# Patient Record
Sex: Female | Born: 1980 | Race: Black or African American | Hispanic: No | Marital: Single | Smoking: Never smoker
Health system: Southern US, Community
[De-identification: ages and names within clinical notes are randomized; demographics above are authoritative.]

## PROBLEM LIST (undated history)

## (undated) DIAGNOSIS — E079 Disorder of thyroid, unspecified: Secondary | ICD-10-CM

---
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
## 2002-12-03 ENCOUNTER — Emergency Department (HOSPITAL_COMMUNITY): Admission: EM | Admit: 2002-12-03 | Discharge: 2002-12-03 | Payer: Self-pay | Admitting: *Deleted

## 2003-04-28 ENCOUNTER — Emergency Department (HOSPITAL_COMMUNITY): Admission: EM | Admit: 2003-04-28 | Discharge: 2003-04-29 | Payer: Self-pay | Admitting: Emergency Medicine

## 2016-01-16 ENCOUNTER — Encounter (HOSPITAL_BASED_OUTPATIENT_CLINIC_OR_DEPARTMENT_OTHER): Payer: Self-pay | Admitting: *Deleted

## 2016-01-16 ENCOUNTER — Emergency Department (HOSPITAL_BASED_OUTPATIENT_CLINIC_OR_DEPARTMENT_OTHER)
Admission: EM | Admit: 2016-01-16 | Discharge: 2016-01-16 | Disposition: A | Discharge status: Home / Self Care | Payer: Self-pay | Attending: Emergency Medicine | Admitting: Emergency Medicine

## 2016-01-16 DIAGNOSIS — Z79899 Other long term (current) drug therapy: Secondary | ICD-10-CM | POA: Insufficient documentation

## 2016-01-16 DIAGNOSIS — Z3202 Encounter for pregnancy test, result negative: Secondary | ICD-10-CM | POA: Insufficient documentation

## 2016-01-16 DIAGNOSIS — N760 Acute vaginitis: Secondary | ICD-10-CM | POA: Insufficient documentation

## 2016-01-16 DIAGNOSIS — N390 Urinary tract infection, site not specified: Secondary | ICD-10-CM | POA: Insufficient documentation

## 2016-01-16 DIAGNOSIS — B9689 Other specified bacterial agents as the cause of diseases classified elsewhere: Secondary | ICD-10-CM

## 2016-01-16 DIAGNOSIS — E079 Disorder of thyroid, unspecified: Secondary | ICD-10-CM | POA: Insufficient documentation

## 2016-01-16 HISTORY — DX: Disorder of thyroid, unspecified: E07.9

## 2016-01-16 LAB — URINE MICROSCOPIC-ADD ON

## 2016-01-16 LAB — URINALYSIS, ROUTINE W REFLEX MICROSCOPIC
Bilirubin Urine: NEGATIVE
Glucose, UA: NEGATIVE mg/dL
KETONES UR: NEGATIVE mg/dL
NITRITE: NEGATIVE
PH: 5.5 (ref 5.0–8.0)
PROTEIN: NEGATIVE mg/dL
Specific Gravity, Urine: 1.037 — ABNORMAL HIGH (ref 1.005–1.030)

## 2016-01-16 LAB — CBC
HCT: 37 % (ref 36.0–46.0)
Hemoglobin: 12.2 g/dL (ref 12.0–15.0)
MCH: 28.4 pg (ref 26.0–34.0)
MCHC: 33 g/dL (ref 30.0–36.0)
MCV: 86.2 fL (ref 78.0–100.0)
Platelets: 339 10*3/uL (ref 150–400)
RBC: 4.29 MIL/uL (ref 3.87–5.11)
RDW: 13.5 % (ref 11.5–15.5)
WBC: 6.1 10*3/uL (ref 4.0–10.5)

## 2016-01-16 LAB — WET PREP, GENITAL
TRICH WET PREP: NONE SEEN
Yeast Wet Prep HPF POC: NONE SEEN

## 2016-01-16 LAB — COMPREHENSIVE METABOLIC PANEL
ALT: 16 U/L (ref 14–54)
ANION GAP: 5 (ref 5–15)
AST: 24 U/L (ref 15–41)
Albumin: 4 g/dL (ref 3.5–5.0)
Alkaline Phosphatase: 62 U/L (ref 38–126)
BUN: 14 mg/dL (ref 6–20)
CHLORIDE: 106 mmol/L (ref 101–111)
CO2: 27 mmol/L (ref 22–32)
CREATININE: 0.88 mg/dL (ref 0.44–1.00)
Calcium: 8.9 mg/dL (ref 8.9–10.3)
Glucose, Bld: 76 mg/dL (ref 65–99)
POTASSIUM: 3.5 mmol/L (ref 3.5–5.1)
Sodium: 138 mmol/L (ref 135–145)
Total Bilirubin: 0.4 mg/dL (ref 0.3–1.2)
Total Protein: 7.2 g/dL (ref 6.5–8.1)

## 2016-01-16 LAB — LIPASE, BLOOD: LIPASE: 22 U/L (ref 11–51)

## 2016-01-16 LAB — PREGNANCY, URINE: Preg Test, Ur: NEGATIVE

## 2016-01-16 MED ORDER — METRONIDAZOLE 500 MG PO TABS: 500.0000 mg | ORAL_TABLET | Freq: Two times a day (BID) | ORAL | Status: DC

## 2016-01-16 MED ORDER — CEPHALEXIN 500 MG PO CAPS: 500.0000 mg | ORAL_CAPSULE | Freq: Two times a day (BID) | ORAL | Status: DC

## 2016-01-16 MED ORDER — SODIUM CHLORIDE 0.9 % IV BOLUS (SEPSIS)
1000.0000 mL | Freq: Once | INTRAVENOUS | Status: AC
  Administered 2016-01-16: 1000 mL via INTRAVENOUS

## 2016-01-16 MED ORDER — MORPHINE SULFATE (PF) 4 MG/ML IV SOLN
4.0000 mg | Freq: Once | INTRAVENOUS | Status: AC
  Administered 2016-01-16: 4 mg via INTRAVENOUS
  Filled 2016-01-16: qty 1

## 2016-01-16 NOTE — Discharge Instructions (Signed)
Please read and follow all provided instructions.  Your diagnoses today include:  1. BV (bacterial vaginosis)   2. UTI (lower urinary tract infection)    Tests performed today include:  Urine test - suggests that you have an infection in your bladder  Vital signs. See below for your results today.   Medications prescribed:   Take medication as prescribed   Home care instructions:  Follow any educational materials contained in this packet.  Follow-up instructions: Please follow-up with your primary care provider in week if symptoms are not resolved for further evaluation of your symptoms.  Return instructions:   Please return to the Emergency Department if you experience worsening symptoms.   Return with fever, worsening pain, persistent vomiting, worsening pain in your back.   Please return if you have any other emergent concerns.  Additional Information:  Your vital signs today were: BP 123/68 mmHg   Pulse 62   Temp(Src) 99 F (37.2 C) (Oral)   Resp 20   Ht 5\' 2"  (1.575 m)   Wt 92.987 kg   BMI 37.49 kg/m2   SpO2 100% If your blood pressure (BP) was elevated above 135/85 this visit, please have this repeated by your doctor within one month. --------------

## 2016-01-16 NOTE — ED Notes (Signed)
Patient c/o intermittent abd pain and nausea over the past two weeks, also c/o vaginal discharge

## 2016-01-16 NOTE — ED Provider Notes (Signed)
CSN: [id number]     Arrival date & time 01/16/16  1217 History   First MD Initiated Contact with Patient 01/16/16 1234     Chief Complaint  Patient presents with  . Abdominal Pain   (Consider location/radiation/quality/duration/timing/severity/associated sxs/prior Treatment) HPI 35 y.o. female presents to the Emergency Department today complaining of generalized abdominal pain x 1-2 weeks. Noted on RLQ, Suprapubic and LLQ. Some pain noted RUQ. Notes pain is mostly RLQ and 8/10. Sharp. Intermittent. Notes N/V/D. No CP/SOB. Noted vaginal discharge as well. No Bleeding. No Dysuria. Has not tried any OTC remedies. LMP beginning of February. No OCP. No fevers. No other symptoms noted.    Past Medical History  Diagnosis Date  . Thyroid disease    Past Surgical History  Procedure Laterality Date  . Back surgery  1992    lumbar   No family history on file. Social History  Substance Use Topics  . Smoking status: Never Smoker   . Smokeless tobacco: None  . Alcohol Use: No   OB History    No data available     Review of Systems ROS reviewed and all are negative for acute change except as noted in the HPI.  Allergies  Review of patient's allergies indicates no known allergies.  Home Medications   Prior to Admission medications   Medication Sig Start Date End Date Taking? Authorizing Provider  levothyroxine (SYNTHROID, LEVOTHROID) 88 MCG tablet Take 88 mcg by mouth daily before breakfast.   Yes Historical Provider, MD   BP 123/68 mmHg  Pulse 62  Temp(Src) 99 F (37.2 C) (Oral)  Resp 20  Ht  (1.575 m)  Wt 92.987 kg  BMI 37.49 kg/m2  SpO2 100%   Physical Exam  Constitutional: She is oriented to person, place, and time. She appears well-developed and well-nourished.  HENT:  Head: Normocephalic and atraumatic.  Eyes: EOM are normal. Pupils are equal, round, and reactive to light.  Neck: Normal range of motion.  Cardiovascular: Normal rate, regular rhythm and normal  heart sounds.   No murmur heard. Pulmonary/Chest: Effort normal and breath sounds normal. No respiratory distress. She has no wheezes. She has no rales. She exhibits no tenderness.  Abdominal: Soft. Normal appearance and bowel sounds are normal. There is tenderness in the right upper quadrant, right lower quadrant, suprapubic area and left lower quadrant. There is no rigidity, no rebound, no guarding, no CVA tenderness, no tenderness at McBurney's point and negative Murphy's sign.  Musculoskeletal: Normal range of motion.  Neurological: She is alert and oriented to person, place, and time.  Skin: Skin is warm and dry.  Psychiatric: She has a normal mood and affect. Her behavior is normal. Thought content normal.  Nursing note and vitals reviewed.  Exam performed by Eston Esters,  exam chaperoned Date: 01/16/2016 Pelvic exam: normal external genitalia without evidence of trauma. VULVA: normal appearing vulva with no masses, tenderness or lesion. VAGINA: normal appearing vagina with normal color and discharge, no lesions. CERVIX: normal appearing cervix without lesions, cervical motion tenderness absent, cervical os closed with out purulent discharge; vaginal discharge - clear and thin, Wet prep and DNA probe for chlamydia and GC obtained.   ADNEXA: normal adnexa in size, nontender and no masses UTERUS: uterus is normal size, shape, consistency and nontender.    ED Course  Procedures (including critical care time) Labs Review Labs Reviewed  WET PREP, GENITAL - Abnormal; Notable for the following:    Clue Cells Wet Prep HPF POC  PRESENT (*)    WBC, Wet Prep HPF POC MODERATE (*)    All other components within normal limits  URINALYSIS, ROUTINE W REFLEX MICROSCOPIC (NOT AT [facility]) - Abnormal; Notable for the following:    APPearance CLOUDY (*)    Specific Gravity, Urine 1.037 (*)    Hgb urine dipstick SMALL (*)    Leukocytes, UA MODERATE (*)    All other components within normal limits   URINE MICROSCOPIC-ADD ON - Abnormal; Notable for the following:    Squamous Epithelial / LPF 0-5 (*)    Bacteria, UA MANY (*)    Crystals CA OXALATE CRYSTALS (*)    All other components within normal limits  PREGNANCY, URINE  CBC  COMPREHENSIVE METABOLIC PANEL  LIPASE, BLOOD  GC/CHLAMYDIA PROBE AMP ([place]) NOT AT [facility]   Imaging Review No results found. I have personally reviewed and evaluated these images and lab results as part of my medical decision-making.   EKG Interpretation None      MDM  I have reviewed and evaluated the relevant laboratory values. I have reviewed the relevant previous healthcare records. I obtained HPI from historian. Patient discussed with supervising physician  ED Course:  Assessment: Patient is a 35yF presents with abdominal pain x 1 week. Noted on lower abdomen. Some tenderness RUQ. On exam, nontoxic, nonseptic appearing, in no apparent distress. Patient's pain and other symptoms adequately managed in emergency department.  Fluid bolus given.  Labs and vitals reviewed. UA shows UTI. Wet Prep showed BV. CBC/CMP unremarkable. No Leukocytosis. Patient does not meet the SIRS or Sepsis criteria. On repeat exam patient does not have a surgical abdomen and there are no peritoneal signs. No indication of appendicitis, bowel obstruction, bowel perforation, cholecystitis, diverticulitis, PID or ectopic pregnancy. Patient discharged home with symptomatic treatment and given strict instructions for follow-up with their primary care physician. Given ABX for UTI and Flagyl for BV. I have also discussed reasons to return immediately to the ER.  Patient expresses understanding and agrees with plan.  Disposition/Plan:  DC Home Additional Verbal discharge instructions given and discussed with patient.  Pt Instructed to f/u with PCP in the next week for evaluation and treatment of symptoms. Return precautions given Pt acknowledges and agrees with plan  Supervising  Physician Melene Planan Floyd, DO   Final diagnoses:  BV (bacterial vaginosis)  UTI (lower urinary tract infection)       Audry Piliyler Ilo Beamon, PA-C 01/16/16 1401  Melene Planan Floyd, DO 01/16/16 1520

## 2016-01-17 LAB — GC/CHLAMYDIA PROBE AMP (~~LOC~~) NOT AT ARMC
Chlamydia: NEGATIVE
Neisseria Gonorrhea: NEGATIVE

## 2016-02-22 ENCOUNTER — Encounter (HOSPITAL_BASED_OUTPATIENT_CLINIC_OR_DEPARTMENT_OTHER): Payer: Self-pay

## 2016-02-22 ENCOUNTER — Emergency Department (HOSPITAL_BASED_OUTPATIENT_CLINIC_OR_DEPARTMENT_OTHER)
Admission: EM | Admit: 2016-02-22 | Discharge: 2016-02-22 | Disposition: A | Discharge status: Home / Self Care | Payer: Self-pay | Attending: Emergency Medicine | Admitting: Emergency Medicine

## 2016-02-22 DIAGNOSIS — K0889 Other specified disorders of teeth and supporting structures: Secondary | ICD-10-CM | POA: Insufficient documentation

## 2016-02-22 MED ORDER — BUPIVACAINE-EPINEPHRINE (PF) 0.5% -1:200000 IJ SOLN
1.8000 mL | Freq: Once | INTRAMUSCULAR | Status: AC
  Administered 2016-02-22: 1.8 mL
  Filled 2016-02-22: qty 1.8

## 2016-02-22 MED ORDER — PENICILLIN V POTASSIUM 500 MG PO TABS: 500.0000 mg | ORAL_TABLET | Freq: Four times a day (QID) | ORAL | Status: AC

## 2016-02-22 MED ORDER — IBUPROFEN 800 MG PO TABS: 800.0000 mg | ORAL_TABLET | Freq: Three times a day (TID) | ORAL | Status: AC

## 2016-02-22 MED FILL — PENICILLIN VK 500 MG TABLET: 500 | 7 days supply | Qty: 28 | Fill #0

## 2016-02-22 NOTE — ED Provider Notes (Signed)
CSN: [id number]     Arrival date & time 02/22/16  1540 History   First MD Initiated Contact with Patient 02/22/16 1611     Chief Complaint  Patient presents with  . Dental Pain     (Consider location/radiation/quality/duration/timing/severity/associated sxs/prior Treatment) HPI   Taylor Mcdaniel is a 35 y.o. female, patient with no pertinent past medical history, presenting to the ED with left upper tooth pain that began about a week ago. Pt rates her pain at 6/10, aching, nonradiating. Denies fever/chills, difficulty breathing or swallowing, nausea/vomiting, or any other complaints.    Past Medical History  Diagnosis Date  . Thyroid disease    Past Surgical History  Procedure Laterality Date  . Back surgery  1992    lumbar   No family history on file. Social History  Substance Use Topics  . Smoking status: Never Smoker   . Smokeless tobacco: None  . Alcohol Use: No   OB History    No data available     Review of Systems  Constitutional: Negative for fever and chills.  HENT: Positive for dental problem. Negative for trouble swallowing.   Gastrointestinal: Negative for nausea and vomiting.      Allergies  Review of patient's allergies indicates no known allergies.  Home Medications   Prior to Admission medications   Medication Sig Start Date End Date Taking? Authorizing Provider  ibuprofen (ADVIL,MOTRIN) 800 MG tablet Take 1 tablet (800 mg total) by mouth 3 (three) times daily. 02/22/16   Kemontae Dunklee C Errica Dutil, PA-C  penicillin v potassium (VEETID) 500 MG tablet Take 1 tablet (500 mg total) by mouth 4 (four) times daily. 02/22/16 02/29/16  Jenna Ardoin C Morry Veiga, PA-C   BP 125/79 mmHg  Pulse 62  Temp(Src) 98 F (36.7 C) (Oral)  Resp 18  Ht 5\' 2"  (1.575 m)  Wt 93.895 kg  BMI 37.85 kg/m2  SpO2 100%  LMP 01/26/2016 Physical Exam  Constitutional: She appears well-developed and well-nourished. No distress.  HENT:  Head: Normocephalic and atraumatic.  Tenderness to left maxillary  rearmost molar. Extensive dental caries through out.  Eyes: Conjunctivae are normal.  Neck: Normal range of motion. Neck supple.  Cardiovascular: Normal rate and regular rhythm.   Pulmonary/Chest: Effort normal.  Lymphadenopathy:    She has no cervical adenopathy.  Neurological: She is alert.  Skin: Skin is warm and dry. She is not diaphoretic.  Nursing note and vitals reviewed.   ED Course  .Nerve Block Date/Time: 02/22/2016 4:31 PM Performed by: Anselm PancoastJOY, Alisha Burgo C Authorized by: Anselm PancoastJOY, Rocky Gladden C Consent: Verbal consent obtained. Risks and benefits: risks, benefits and alternatives were discussed Consent given by: patient Patient understanding: patient states understanding of the procedure being performed Patient consent: the patient's understanding of the procedure matches consent given Procedure consent: procedure consent matches procedure scheduled Patient identity confirmed: verbally with patient and arm band Indications: pain relief Body area: face/mouth Nerve: posterior superior alveolar Laterality: left Patient sedated: no Patient position: sitting Needle gauge: 27 G Location technique: anatomical landmarks Local anesthetic: bupivacaine 0.5% with epinephrine Anesthetic total: 1.8 ml Outcome: pain improved Patient tolerance: Patient tolerated the procedure well with no immediate complications   (including critical care time)   MDM   Final diagnoses:  Tooth pain    Taylor Mcdaniel presents with left upper tooth pain for the last week.  Suspect pain due to dental caries. Patient responded well to dental block. Patient instructed to follow-up with a dentist as soon as possible, but certainly within the timeframe  she is taking the antibiotic. Return precautions discussed. Patient voiced stating of these instructions and is comfortable with discharge.    Anselm Pancoast, PA-C 02/23/16 1642  Doug Sou, MD 02/23/16 2336

## 2016-02-22 NOTE — ED Notes (Signed)
Left upper toothache x 4 days-tooth broken "months ago"-did not seek dental cae-NAD-steady gait

## 2016-02-22 NOTE — Discharge Instructions (Signed)
You have been seen today for tooth pain. Please take all of your antibiotics until finished!   You may develop abdominal discomfort or diarrhea from the antibiotic.  You may help offset this with probiotics which you can buy or get in yogurt. Do not eat or take the probiotics until 2 hours after your antibiotic. You should follow-up with a dentist as soon as possible, but definitely within the timeframe you are still on the antibiotics. Follow up with PCP as needed. Return to ED should symptoms worsen.  Dental Assistance If the dentist on-call cannot see you, please use the resources below:   Patients with Medicaid: [facility] [phone], [phone], [phone]  If unable to pay, or uninsured, contact [facility] [phone] or [facility] [phone] in [place], [phone] in [facility]) to become qualified for the adult dental clinic  Other Low-Cost Community Dental Services: [facility]- [address], [place], [place], [address]    [phone], [phone]. [phone]    2nd and 4th Thursday of the month at 6:30am    10 clients each day by appointment, can sometimes see walk-in     patients if someone does not show for an appointment [facility]- [address], [place], [place], [address]    [phone] [facility]- [address], [place], [place], [address]    [phone]  [facility]- [phone] [facility]- [phone] [facility]- [phone]

## 2016-05-26 ENCOUNTER — Emergency Department (HOSPITAL_BASED_OUTPATIENT_CLINIC_OR_DEPARTMENT_OTHER): Payer: Self-pay

## 2016-05-26 ENCOUNTER — Emergency Department (HOSPITAL_BASED_OUTPATIENT_CLINIC_OR_DEPARTMENT_OTHER)
Admission: EM | Admit: 2016-05-26 | Discharge: 2016-05-26 | Disposition: A | Discharge status: Home / Self Care | Payer: Self-pay | Attending: Emergency Medicine | Admitting: Emergency Medicine

## 2016-05-26 ENCOUNTER — Encounter (HOSPITAL_BASED_OUTPATIENT_CLINIC_OR_DEPARTMENT_OTHER): Payer: Self-pay | Admitting: *Deleted

## 2016-05-26 DIAGNOSIS — R102 Pelvic and perineal pain: Secondary | ICD-10-CM | POA: Insufficient documentation

## 2016-05-26 DIAGNOSIS — R109 Unspecified abdominal pain: Secondary | ICD-10-CM

## 2016-05-26 DIAGNOSIS — B9689 Other specified bacterial agents as the cause of diseases classified elsewhere: Secondary | ICD-10-CM

## 2016-05-26 DIAGNOSIS — N760 Acute vaginitis: Secondary | ICD-10-CM | POA: Insufficient documentation

## 2016-05-26 LAB — ABO/RH: ABO/RH(D): B POS

## 2016-05-26 LAB — URINALYSIS, ROUTINE W REFLEX MICROSCOPIC
BILIRUBIN URINE: NEGATIVE
GLUCOSE, UA: NEGATIVE mg/dL
HGB URINE DIPSTICK: NEGATIVE
KETONES UR: NEGATIVE mg/dL
Leukocytes, UA: NEGATIVE
Nitrite: NEGATIVE
PROTEIN: NEGATIVE mg/dL
Specific Gravity, Urine: 1.023 (ref 1.005–1.030)
pH: 6.5 (ref 5.0–8.0)

## 2016-05-26 LAB — WET PREP, GENITAL
Sperm: NONE SEEN
Trich, Wet Prep: NONE SEEN
YEAST WET PREP: NONE SEEN

## 2016-05-26 LAB — PREGNANCY, URINE: PREG TEST UR: NEGATIVE

## 2016-05-26 LAB — HCG, QUANTITATIVE, PREGNANCY: hCG, Beta Chain, Quant, S: <1 m[IU]/mL (ref <5)

## 2016-05-26 MED ORDER — HYDROCODONE-ACETAMINOPHEN 5-325 MG PO TABS
2.0000 | ORAL_TABLET | Freq: Once | ORAL | Status: AC
  Administered 2016-05-26: 2 via ORAL
  Filled 2016-05-26: qty 2

## 2016-05-26 MED ORDER — DICLOFENAC SODIUM 50 MG PO TBEC: 50.0000 mg | DELAYED_RELEASE_TABLET | Freq: Two times a day (BID) | ORAL | 0 refills | Status: AC

## 2016-05-26 MED ORDER — METRONIDAZOLE 500 MG PO TABS: 500.0000 mg | ORAL_TABLET | Freq: Two times a day (BID) | ORAL | 0 refills | Status: AC

## 2016-05-26 NOTE — ED Triage Notes (Signed)
Pt states spontaneous abortin x 1 month ago , cont abd pain and bleeding, " Im here to see if I need a d&c "

## 2016-05-26 NOTE — ED Provider Notes (Signed)
[facility] Provider Note   CSN: [id number] Arrival date & time: 05/26/16  1444  By signing my name below, I, Phillis Haggis, attest that this documentation has been prepared under the direction and in the presence of Langston Masker, [place]. Electronically Signed: Phillis Haggis, ED Scribe. 05/26/16. 4:15 PM.  First Provider Contact:  None    History   Chief Complaint Chief Complaint  Patient presents with  . Abdominal Pain   The history is provided by the patient. No language interpreter was used.  HPI Comments: Taylor Mcdaniel is a 35 y.o. female who presents to the Emergency Department complaining of gradually worsening lower abdominal pain that radiates to the back onset one month ago. She reports associated vaginal bleeding. Pt states that she had a spontaneous abortion at the beginning of July and was a few weeks pregnant. Pt had one other pregnancy that has also resulted in a miscarriage. Her symptoms have been constant since the miscarriage in July. She has not followed up with a doctor since returning to town. She does not know her blood type. She denies fever, chills, nausea, or vomiting.   Past Medical History:  Diagnosis Date  . Thyroid disease     There are no active problems to display for this patient.   Past Surgical History:  Procedure Laterality Date  . BACK SURGERY  1992   lumbar    OB History    No data available       Home Medications    Prior to Admission medications   Medication Sig Start Date End Date Taking? Authorizing Provider  ibuprofen (ADVIL,MOTRIN) 800 MG tablet Take 1 tablet (800 mg total) by mouth 3 (three) times daily. 02/22/16   Anselm Pancoast, PA-C    Family History No family history on file.  Social History Social History  Substance Use Topics  . Smoking status: Never Smoker  . Smokeless tobacco: Not on file  . Alcohol use No     Allergies   Review of patient's allergies indicates no known allergies.   Review of  Systems Review of Systems  Constitutional: Negative for chills and fever.  Gastrointestinal: Positive for abdominal pain. Negative for nausea and vomiting.  Genitourinary: Positive for vaginal bleeding.  All other systems reviewed and are negative.  Physical Exam Updated Vital Signs BP 146/76   Pulse (!) 56   Temp 98.1 F (36.7 C)   Resp 18   LMP 04/25/2016   SpO2 100%   Physical Exam  Constitutional: She is oriented to person, place, and time. She appears well-developed and well-nourished.  HENT:  Head: Normocephalic and atraumatic.  Mouth/Throat: Oropharynx is clear and moist.  Eyes: Conjunctivae and EOM are normal. Pupils are equal, round, and reactive to light.  Neck: Normal range of motion. Neck supple.  Cardiovascular: Normal rate and regular rhythm.   Pulmonary/Chest: Effort normal.  Abdominal: Soft. There is tenderness in the suprapubic area.  Genitourinary:  Genitourinary Comments: Scant white vaginal discharge, cervix normal with mild tenderness; adnexa without mass and non-tender  Musculoskeletal: Normal range of motion.       Lumbar back: She exhibits tenderness.  Bilateral lumbar spine tenderness  Neurological: She is alert and oriented to person, place, and time.  Skin: Skin is warm and dry.  Psychiatric: She has a normal mood and affect. Her behavior is normal.  Nursing note and vitals reviewed.    ED Treatments / Results  DIAGNOSTIC STUDIES: Oxygen Saturation is 100% on RA, normal by  my interpretation.    COORDINATION OF CARE: 4:13 PM-Discussed treatment plan which includes labs, [place], and pelvic exam with pt at bedside and pt agreed to plan.    Labs (all labs ordered are listed, but only abnormal results are displayed) Labs Reviewed  URINALYSIS, ROUTINE W REFLEX MICROSCOPIC (NOT AT [facility]) - Abnormal; Notable for the following:       Result Value   APPearance CLOUDY (*)    All other components within normal limits  WET PREP, GENITAL  PREGNANCY,  URINE  HCG, QUANTITATIVE, PREGNANCY  ABO/RH  GC/CHLAMYDIA PROBE AMP ([place]) NOT AT [facility]    EKG  EKG Interpretation None       Radiology No results found.  Procedures Procedures (including critical care time)  Medications Ordered in ED Medications - No data to display   Initial Impression / Assessment and Plan / ED Course  I have reviewed the triage vital signs and the nursing notes.  Pertinent labs & imaging results that were available during my care of the patient were reviewed by me and considered in my medical decision making (see chart for details).  Clinical Course   MDM: Pt reports miscarriage in July and seen at a hospital in [place]. She does not know if she had a complete miscarriage; quantitative hCG, type and Rh, and pelvic US will be obtained to evaluate further.     Final Clinical Impressions(s) / ED Diagnoses   Final diagnoses:  Abdominal pain  Pelvic pain in female  BV (bacterial vaginosis)    I personally performed the services in this documentation, which was scribed in my presence.  The recorded information has been reviewed and considered.   Barnet Pall. New Prescriptions New Prescriptions   No medications on file     Elson Areas, PA-C 05/27/16 3244    Lavera Guise, MD 05/27/16 [phone]

## 2016-05-29 LAB — GC/CHLAMYDIA PROBE AMP (~~LOC~~) NOT AT ARMC
Chlamydia: NEGATIVE
NEISSERIA GONORRHEA: NEGATIVE

## 2016-09-24 ENCOUNTER — Emergency Department (HOSPITAL_COMMUNITY)
Admission: EM | Admit: 2016-09-24 | Discharge: 2016-09-24 | Disposition: A | Discharge status: Home / Self Care | Payer: Self-pay | Attending: Emergency Medicine | Admitting: Emergency Medicine

## 2016-09-24 ENCOUNTER — Encounter (HOSPITAL_COMMUNITY): Payer: Self-pay

## 2016-09-24 DIAGNOSIS — N898 Other specified noninflammatory disorders of vagina: Secondary | ICD-10-CM | POA: Insufficient documentation

## 2016-09-24 LAB — WET PREP, GENITAL
CLUE CELLS WET PREP: NONE SEEN
Sperm: NONE SEEN
Trich, Wet Prep: NONE SEEN
Yeast Wet Prep HPF POC: NONE SEEN

## 2016-09-24 LAB — URINALYSIS, ROUTINE W REFLEX MICROSCOPIC
BILIRUBIN URINE: NEGATIVE
GLUCOSE, UA: NEGATIVE mg/dL
KETONES UR: NEGATIVE mg/dL
Nitrite: NEGATIVE
PROTEIN: NEGATIVE mg/dL
Specific Gravity, Urine: 1.025 (ref 1.005–1.030)
pH: 6.5 (ref 5.0–8.0)

## 2016-09-24 LAB — POC URINE PREG, ED: PREG TEST UR: NEGATIVE

## 2016-09-24 LAB — URINE MICROSCOPIC-ADD ON

## 2016-09-24 MED ORDER — AZITHROMYCIN 250 MG PO TABS
1000.0000 mg | ORAL_TABLET | Freq: Once | ORAL | Status: AC
  Administered 2016-09-24: 1000 mg via ORAL
  Filled 2016-09-24: qty 4

## 2016-09-24 MED ORDER — CEFTRIAXONE SODIUM 250 MG IJ SOLR
250.0000 mg | Freq: Once | INTRAMUSCULAR | Status: AC
  Administered 2016-09-24: 250 mg via INTRAMUSCULAR
  Filled 2016-09-24: qty 250

## 2016-09-24 NOTE — ED Provider Notes (Signed)
[facility] Provider Note   CSN: [id number] Arrival date & time: 09/24/16  1540     History   Chief Complaint No chief complaint on file.   HPI Taylor Mcdaniel is a 35 y.o. female.  The history is provided by the patient and medical records. No language interpreter was used.   Taylor Mcdaniel is a 35 y.o. female  with a PMH of thyroid disease who presents to the Emergency Department complaining of persistent vaginal discharge times one and half weeks. Patient states that she was treated for bacterial vaginosis in October. She took all antibiotics as directed and symptoms completely resolved. A week and a half ago, the same symptoms of vaginal discharge and intermittent lower abdominal cramping began. She denies dysuria, urinary urgency/frequency, back pain. After being diagnosed with BV in October, she states that she has not had any unprotected intercourse. No medications taken prior to arrival for symptoms.    Past Medical History:  Diagnosis Date  . Thyroid disease     There are no active problems to display for this patient.   Past Surgical History:  Procedure Laterality Date  . BACK SURGERY  1992   lumbar    OB History    No data available       Home Medications    Prior to Admission medications   Medication Sig Start Date End Date Taking? Authorizing Provider  diclofenac (VOLTAREN) 50 MG EC tablet Take 1 tablet (50 mg total) by mouth 2 (two) times daily. 05/26/16   Elson AreasLeslie K Sofia, PA-C  ibuprofen (ADVIL,MOTRIN) 800 MG tablet Take 1 tablet (800 mg total) by mouth 3 (three) times daily. 02/22/16   Shawn C Joy, PA-C  metroNIDAZOLE (FLAGYL) 500 MG tablet Take 1 tablet (500 mg total) by mouth 2 (two) times daily. 05/26/16   Elson AreasLeslie K Sofia, PA-C    Family History No family history on file.  Social History Social History  Substance Use Topics  . Smoking status: Never Smoker  . Smokeless tobacco: Never Used  . Alcohol use No     Allergies   Patient has no  known allergies.   Review of Systems Review of Systems  Constitutional: Negative for chills and fever.  HENT: Negative for congestion.   Eyes: Negative for visual disturbance.  Respiratory: Negative for cough and shortness of breath.   Cardiovascular: Negative.   Gastrointestinal: Positive for abdominal pain. Negative for nausea and vomiting.  Genitourinary: Positive for vaginal discharge. Negative for dysuria, frequency and urgency.  Musculoskeletal: Negative for back pain and neck pain.  Skin: Negative for rash.  Neurological: Negative for headaches.     Physical Exam Updated Vital Signs BP 121/56   Pulse 61   Temp 97.8 F (36.6 C) (Oral)   Resp 14   Ht 5\' 2"  (1.575 m)   Wt 99.4 kg   SpO2 98%   BMI 40.08 kg/m   Physical Exam  Constitutional: She is oriented to person, place, and time. She appears well-developed and well-nourished. No distress.  HENT:  Head: Normocephalic and atraumatic.  Cardiovascular: Normal rate, regular rhythm and normal heart sounds.   No murmur heard. Pulmonary/Chest: Effort normal and breath sounds normal. No respiratory distress.  Abdominal: Soft. She exhibits no distension. There is no tenderness.  Genitourinary:  Genitourinary Comments: Chaperone present for exam. Small amount of menstrual bleeding present. + discharge. No cervical motion or adnexal tenderness. No adnexal masses or fullness appreciated.  Musculoskeletal: She exhibits no edema.  Neurological: She is alert  and oriented to person, place, and time.  Skin: Skin is warm and dry.  Nursing note and vitals reviewed.    ED Treatments / Results  Labs (all labs ordered are listed, but only abnormal results are displayed) Labs Reviewed  WET PREP, GENITAL - Abnormal; Notable for the following:       Result Value   WBC, Wet Prep HPF POC MODERATE (*)    All other components within normal limits  URINALYSIS, ROUTINE W REFLEX MICROSCOPIC (NOT AT [facility]) - Abnormal; Notable for the  following:    APPearance CLOUDY (*)    Hgb urine dipstick MODERATE (*)    Leukocytes, UA TRACE (*)    All other components within normal limits  URINE MICROSCOPIC-ADD ON - Abnormal; Notable for the following:    Squamous Epithelial / LPF 6-30 (*)    Bacteria, UA FEW (*)    All other components within normal limits  RPR  HIV ANTIBODY (ROUTINE TESTING)  POC URINE PREG, ED  GC/CHLAMYDIA PROBE AMP ([place]) NOT AT [facility]    EKG  EKG Interpretation None       Radiology No results found.  Procedures Procedures (including critical care time)  Medications Ordered in ED Medications  cefTRIAXone (ROCEPHIN) injection 250 mg (250 mg Intramuscular Given 09/24/16 1906)  azithromycin (ZITHROMAX) tablet 1,000 mg (1,000 mg Oral Given 09/24/16 1906)     Initial Impression / Assessment and Plan / ED Course  I have reviewed the triage vital signs and the nursing notes.  Pertinent labs & imaging results that were available during my care of the patient were reviewed by me and considered in my medical decision making (see chart for details).  Clinical Course    Taylor Mcdaniel is a 35 y.o. female who presents to ED for vaginal discharge x 1.5 weeks. She is very concerned for sexually transmitted infections and requesting thorough workup. On exam, patient is afebrile, very well-appearing with benign abdominal exam. She does have mild discharge on GU exam with no cervical motion or adnexal tenderness. Urine with trace leuks and 0-5 white cells. No CVA tenderness on exam. Doubt UTI. Wet prep shows moderate white cells but otherwise unremarkable. Gonorrhea and chlamydia obtained. RPR and HIV obtained as well. Patient would like to receive prophylactic antibiotic treatment. She has a follow-up appointment with her women's health provider on December 14 and was encouraged to keep this scheduled appointment. Reasons to return to the ER were discussed and all questions answered.   Final Clinical  Impressions(s) / ED Diagnoses   Final diagnoses:  Vaginal discharge    New Prescriptions New Prescriptions   No medications on file     [facility] Johnson HospitalJaime Pilcher Othel Hoogendoorn, PA-C 09/24/16 1926    Maia PlanJoshua G Long, MD 09/25/16 [phone]

## 2016-09-24 NOTE — Discharge Instructions (Signed)
It was my pleasure taking care of you today!  Use a condom with every sexual encounter Keep your scheduled appointment with your OBGYN for discussion of today's visit.   Please return to the ER for worsening symptoms, high fevers or persistent vomiting.  You have been tested for HIV, syphilis, chlamydia and gonorrhea. These results will be available in approximately 3 days. You will be notified if they are positive.    SEEK IMMEDIATE MEDICAL CARE IF:  You develop an oral temperature above 102 F (38.9 C), not controlled by medications or lasting more than 2 days.  You develop an increase in pain.  You develop vaginal bleeding and it is not time for your period.  You develop painful intercourse.

## 2016-09-24 NOTE — ED Triage Notes (Signed)
Patient complains of ongoing vaginal discharge after being seen and treated recently for bacterial vaginosis. Denies dysuria, NAD

## 2016-09-24 NOTE — ED Notes (Signed)
Pt stable, ambulatory, states understanding of discharge instructions 

## 2016-09-25 LAB — GC/CHLAMYDIA PROBE AMP (~~LOC~~) NOT AT ARMC
Chlamydia: NEGATIVE
Neisseria Gonorrhea: NEGATIVE

## 2016-09-25 LAB — RPR: RPR: NONREACTIVE

## 2016-09-26 LAB — HIV ANTIBODY (ROUTINE TESTING W REFLEX): HIV SCREEN 4TH GENERATION: NONREACTIVE

## 2016-10-05 ENCOUNTER — Encounter: Payer: Self-pay | Admitting: Obstetrics & Gynecology

## 2016-10-26 ENCOUNTER — Encounter: Payer: Self-pay | Admitting: Family Medicine

## 2016-10-26 DIAGNOSIS — Z01419 Encounter for gynecological examination (general) (routine) without abnormal findings: Secondary | ICD-10-CM

## 2017-03-09 ENCOUNTER — Other Ambulatory Visit: Payer: Self-pay

## 2017-03-09 ENCOUNTER — Encounter (HOSPITAL_BASED_OUTPATIENT_CLINIC_OR_DEPARTMENT_OTHER): Payer: Self-pay | Admitting: Emergency Medicine

## 2017-03-09 ENCOUNTER — Emergency Department (HOSPITAL_BASED_OUTPATIENT_CLINIC_OR_DEPARTMENT_OTHER): Payer: Self-pay

## 2017-03-09 ENCOUNTER — Emergency Department (HOSPITAL_BASED_OUTPATIENT_CLINIC_OR_DEPARTMENT_OTHER)
Admission: EM | Admit: 2017-03-09 | Discharge: 2017-03-09 | Disposition: A | Discharge status: Home / Self Care | Payer: Self-pay | Attending: Emergency Medicine | Admitting: Emergency Medicine

## 2017-03-09 DIAGNOSIS — R0789 Other chest pain: Secondary | ICD-10-CM | POA: Insufficient documentation

## 2017-03-09 DIAGNOSIS — R5383 Other fatigue: Secondary | ICD-10-CM | POA: Insufficient documentation

## 2017-03-09 DIAGNOSIS — Z79899 Other long term (current) drug therapy: Secondary | ICD-10-CM | POA: Insufficient documentation

## 2017-03-09 LAB — CBC
HCT: 34.5 % — ABNORMAL LOW (ref 36.0–46.0)
Hemoglobin: 11.5 g/dL — ABNORMAL LOW (ref 12.0–15.0)
MCH: 28.5 pg (ref 26.0–34.0)
MCHC: 33.3 g/dL (ref 30.0–36.0)
MCV: 85.6 fL (ref 78.0–100.0)
Platelets: 299 10*3/uL (ref 150–400)
RBC: 4.03 MIL/uL (ref 3.87–5.11)
RDW: 13.4 % (ref 11.5–15.5)
WBC: 5.6 10*3/uL (ref 4.0–10.5)

## 2017-03-09 LAB — URINALYSIS, ROUTINE W REFLEX MICROSCOPIC
Bilirubin Urine: NEGATIVE
GLUCOSE, UA: NEGATIVE mg/dL
Hgb urine dipstick: NEGATIVE
Ketones, ur: NEGATIVE mg/dL
Nitrite: NEGATIVE
PH: 8.5 — AB (ref 5.0–8.0)
Protein, ur: NEGATIVE mg/dL
Specific Gravity, Urine: 1.026 (ref 1.005–1.030)

## 2017-03-09 LAB — BASIC METABOLIC PANEL
Anion gap: 6 (ref 5–15)
BUN: 10 mg/dL (ref 6–20)
CALCIUM: 9 mg/dL (ref 8.9–10.3)
CO2: 29 mmol/L (ref 22–32)
CREATININE: 0.81 mg/dL (ref 0.44–1.00)
Chloride: 103 mmol/L (ref 101–111)
GFR calc Af Amer: >60 mL/min (ref >60)
GFR calc non Af Amer: >60 mL/min (ref >60)
Glucose, Bld: 93 mg/dL (ref 65–99)
Potassium: 4 mmol/L (ref 3.5–5.1)
SODIUM: 138 mmol/L (ref 135–145)

## 2017-03-09 LAB — TROPONIN I

## 2017-03-09 LAB — URINALYSIS, MICROSCOPIC (REFLEX): RBC / HPF: NONE SEEN RBC/hpf (ref 0–5)

## 2017-03-09 LAB — PREGNANCY, URINE: Preg Test, Ur: NEGATIVE

## 2017-03-09 MED ORDER — NAPROXEN 500 MG PO TABS: 500.0000 mg | ORAL_TABLET | Freq: Two times a day (BID) | ORAL | 0 refills | Status: AC

## 2017-03-09 NOTE — ED Triage Notes (Signed)
Fatigue x 3 weeks with intermittent sharp chest pain to L chest radiating to L arm.

## 2017-03-09 NOTE — ED Notes (Signed)
Pt reports being "extra stressed" from work in past few weeks and has been eating a lot of greasier foods than normal.  Pt states today she felt light headed, dizzy, nauseous, and SOB with the chest pain that "comes and goes."  Pt states she is concerned about her iron level being low and she's been feeling very fatigued lately.

## 2017-03-09 NOTE — ED Provider Notes (Signed)
[facility]   CSN: [id number] Arrival date & time: 03/09/17  1358     History   Chief Complaint Chief Complaint  Patient presents with  . Chest Pain  . Fatigue    HPI Taylor Mcdaniel is a 36 y.o. female.  HPI Patient was fatigue for approximately 3-4 weeks. She works 7 days a week at a stressful job. She reports she is approximately 3 weeks late on her menstrual cycle. She reports she does have history of heavy menstrual cycles and has had anemia in the past. She also has noted that she's gotten intermittent sharp pains to her left upper chest. The common the go. No associated cough, fever or dyspnea. No associated lower extremity swelling or calf pain. Past Medical History:  Diagnosis Date  . Thyroid disease     There are no active problems to display for this patient.   Past Surgical History:  Procedure Laterality Date  . BACK SURGERY  1992   lumbar    OB History    No data available       Home Medications    Prior to Admission medications   Medication Sig Start Date End Date Taking? Authorizing Provider  diclofenac (VOLTAREN) 50 MG EC tablet Take 1 tablet (50 mg total) by mouth 2 (two) times daily. 05/26/16   Elson Areas, PA-C  ibuprofen (ADVIL,MOTRIN) 800 MG tablet Take 1 tablet (800 mg total) by mouth 3 (three) times daily. 02/22/16   Joy, Shawn C, PA-C  metroNIDAZOLE (FLAGYL) 500 MG tablet Take 1 tablet (500 mg total) by mouth 2 (two) times daily. 05/26/16   Elson Areas, PA-C  naproxen (NAPROSYN) 500 MG tablet Take 1 tablet (500 mg total) by mouth 2 (two) times daily. 03/09/17   Arby Barrette, MD    Family History No family history on file.  Social History Social History  Substance Use Topics  . Smoking status: Never Smoker  . Smokeless tobacco: Never Used  . Alcohol use No     Allergies   Patient has no known allergies.   Review of Systems Review of Systems 10 Systems reviewed and are negative for acute change  except as noted in the HPI.   Physical Exam Updated Vital Signs BP 104/66   Pulse (!) 58   Temp 98.3 F (36.8 C) (Oral)   Resp 18   Ht 5\' 2"  (1.575 m)   Wt 220 lb (99.8 kg)   LMP 01/21/2017   SpO2 100%   BMI 40.24 kg/m   Physical Exam  Constitutional: She is oriented to person, place, and time. She appears well-developed and well-nourished. No distress.  HENT:  Head: Normocephalic and atraumatic.  Nose: Nose normal.  Mouth/Throat: Oropharynx is clear and moist.  Eyes: Conjunctivae and EOM are normal.  Neck: Neck supple.  Cardiovascular: Normal rate, regular rhythm, normal heart sounds and intact distal pulses.   No murmur heard. Pulmonary/Chest: Effort normal and breath sounds normal. No respiratory distress. She exhibits tenderness.  Reducible chest wall pain left upper chest.  Abdominal: Soft. There is no tenderness.  Musculoskeletal: She exhibits no edema or tenderness.  No peripheral edema, no calf tenderness.  Neurological: She is alert and oriented to person, place, and time. No cranial nerve deficit. She exhibits normal muscle tone. Coordination normal.  Skin: Skin is warm and dry.  Psychiatric: She has a normal mood and affect.  Nursing note and vitals reviewed.    ED Treatments / Results  Labs (all  labs ordered are listed, but only abnormal results are displayed) Labs Reviewed  URINALYSIS, ROUTINE W REFLEX MICROSCOPIC - Abnormal; Notable for the following:       Result Value   APPearance CLOUDY (*)    pH 8.5 (*)    Leukocytes, UA TRACE (*)    All other components within normal limits  URINALYSIS, MICROSCOPIC (REFLEX) - Abnormal; Notable for the following:    Bacteria, UA MANY (*)    Squamous Epithelial / LPF 6-30 (*)    All other components within normal limits  CBC - Abnormal; Notable for the following:    Hemoglobin 11.5 (*)    HCT 34.5 (*)    All other components within normal limits  PREGNANCY, URINE  BASIC METABOLIC PANEL  TROPONIN I     EKG  EKG Interpretation  Date/Time:  Friday Mar 09 2017 14:08:29 EDT Ventricular Rate:  54 PR Interval:  158 QRS Duration: 80 QT Interval:  394 QTC Calculation: 373 R Axis:   -13 Text Interpretation:  Sinus bradycardia Otherwise normal ECG agree. no change Confirmed by Arby BarrettePfeiffer, Hanzel Pizzo [phone]([id number]) on 03/09/2017 3:06:16 PM       Radiology Dg Chest 2 View  Result Date: 03/09/2017 CLINICAL DATA:  Intermittent chest pain for 2-3 weeks EXAM: CHEST  2 VIEW COMPARISON:  03/23/2016, 01/04/2008 FINDINGS: The heart size and mediastinal contours are within normal limits. Both lungs are clear. The visualized skeletal structures are unremarkable. IMPRESSION: No active cardiopulmonary disease. Electronically Signed   By: Jasmine PangKim  Fujinaga M.D.   On: 03/09/2017 14:39    Procedures Procedures (including critical care time)  Medications Ordered in ED Medications - No data to display   Initial Impression / Assessment and Plan / ED Course  I have reviewed the triage vital signs and the nursing notes.  Pertinent labs & imaging results that were available during my care of the patient were reviewed by me and considered in my medical decision making (see chart for details).     Final Clinical Impressions(s) / ED Diagnoses   Final diagnoses:  Fatigue, unspecified type  Chest wall pain  Patient is clinically well and appearance. She has experiencing fatigue for several weeks. She does work 7 days a week. She reports her job is stressful. Diagnostic evaluation does not indicate anemia, metabolic derangement, pneumonia or other evident source for the patient's symptoms. Chest pain is atypical, it is stabbing and sharp and intermittent. There is reproducible chest wall component. At this time I have low suspicion for PE, dissection, MI or other emergent etiology. Patient is counseled on close follow-up with PCP for monitoring of symptoms. She will be given naproxen to use for chest wall pain.  New  Prescriptions New Prescriptions   NAPROXEN (NAPROSYN) 500 MG TABLET    Take 1 tablet (500 mg total) by mouth 2 (two) times daily.     Arby BarrettePfeiffer, Aubery Date, MD 03/09/17 [phone]

## 2017-04-15 DIAGNOSIS — S30860A Insect bite (nonvenomous) of lower back and pelvis, initial encounter: Secondary | ICD-10-CM | POA: Insufficient documentation

## 2017-04-15 DIAGNOSIS — Y939 Activity, unspecified: Secondary | ICD-10-CM | POA: Insufficient documentation

## 2017-04-15 DIAGNOSIS — Z5321 Procedure and treatment not carried out due to patient leaving prior to being seen by health care provider: Secondary | ICD-10-CM | POA: Insufficient documentation

## 2017-04-15 DIAGNOSIS — W57XXXA Bitten or stung by nonvenomous insect and other nonvenomous arthropods, initial encounter: Secondary | ICD-10-CM | POA: Insufficient documentation

## 2017-04-15 DIAGNOSIS — Y999 Unspecified external cause status: Secondary | ICD-10-CM | POA: Insufficient documentation

## 2017-04-15 DIAGNOSIS — Y929 Unspecified place or not applicable: Secondary | ICD-10-CM | POA: Insufficient documentation

## 2017-04-16 ENCOUNTER — Emergency Department (HOSPITAL_COMMUNITY)
Admission: EM | Admit: 2017-04-16 | Discharge: 2017-04-16 | Disposition: A | Discharge status: Home / Self Care | Payer: Self-pay | Attending: Emergency Medicine | Admitting: Emergency Medicine

## 2017-04-16 ENCOUNTER — Encounter (HOSPITAL_COMMUNITY): Payer: Self-pay

## 2017-04-16 NOTE — ED Triage Notes (Signed)
Pt reports thinking she was bit by a spider about 5 days ago while sitting under a tree. Pt reports pain has increased since then. Small raised area present to upper back with small amount of drainage present from center of raised area.

## 2017-06-13 ENCOUNTER — Emergency Department (HOSPITAL_COMMUNITY): Payer: Self-pay

## 2017-06-13 ENCOUNTER — Emergency Department (HOSPITAL_COMMUNITY)
Admission: EM | Admit: 2017-06-13 | Discharge: 2017-06-13 | Disposition: A | Discharge status: Home / Self Care | Payer: Self-pay | Attending: Emergency Medicine | Admitting: Emergency Medicine

## 2017-06-13 ENCOUNTER — Encounter (HOSPITAL_COMMUNITY): Payer: Self-pay | Admitting: Emergency Medical Services

## 2017-06-13 DIAGNOSIS — W208XXA Other cause of strike by thrown, projected or falling object, initial encounter: Secondary | ICD-10-CM | POA: Insufficient documentation

## 2017-06-13 DIAGNOSIS — Y939 Activity, unspecified: Secondary | ICD-10-CM | POA: Insufficient documentation

## 2017-06-13 DIAGNOSIS — S90931A Unspecified superficial injury of right great toe, initial encounter: Secondary | ICD-10-CM | POA: Insufficient documentation

## 2017-06-13 DIAGNOSIS — Y998 Other external cause status: Secondary | ICD-10-CM | POA: Insufficient documentation

## 2017-06-13 DIAGNOSIS — M79674 Pain in right toe(s): Secondary | ICD-10-CM

## 2017-06-13 DIAGNOSIS — Y929 Unspecified place or not applicable: Secondary | ICD-10-CM | POA: Insufficient documentation

## 2017-06-13 NOTE — ED Notes (Signed)
Pt state she understands instructions. Hiome stable with stable with steady gait.

## 2017-06-13 NOTE — ED Provider Notes (Signed)
[facility] Provider Note   CSN: [id number] Arrival date & time: 06/13/17  0865     History   Chief Complaint Chief Complaint  Patient presents with  . Toe Injury    HPI Taylor Mcdaniel is a 36 y.o. female.  HPI   Taylor Mcdaniel is a 36yo female with no significant past medical history presenting to the Emergency Department for evaluation of right big toe pain. She states that a couple of days ago she was moving a couch with her niece when she accidentally lost her grip and one of the legs of the couch fell onto her toe. The toe has been painful since that time, she has continued to walk on that foot without difficulty. Yesterday when she got out of the shower she noticed that a thick yellow pus was "oozing" from the toenail from the sides and top of the nail. It has not oozed since that time. 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 She endorses feeling "chills" yesterday, has not taken her temperature. Denies rashes, wounds on any other area of her body, gait problem, joint pain, chest pain, shortness of breath.   Past Medical History:  Diagnosis Date  . Thyroid disease     There are no active problems to display for this patient.   Past Surgical History:  Procedure Laterality Date  . BACK SURGERY  1992   lumbar    OB History    No data available       Home Medications    Prior to Admission medications   Medication Sig Start Date End Date Taking? Authorizing Provider  diclofenac (VOLTAREN) 50 MG EC tablet Take 1 tablet (50 mg total) by mouth 2 (two) times daily. 05/26/16   Elson Areas, PA-C  ibuprofen (ADVIL,MOTRIN) 800 MG tablet Take 1 tablet (800 mg total) by mouth 3 (three) times daily. 02/22/16   Joy, Shawn C, PA-C  metroNIDAZOLE (FLAGYL) 500 MG tablet Take 1 tablet (500 mg total) by mouth 2 (two) times daily. 05/26/16   Elson Areas, PA-C  naproxen (NAPROSYN) 500 MG tablet Take 1 tablet (500 mg total) by  mouth 2 (two) times daily. 03/09/17   Arby Barrette, MD    Family History No family history on file.  Social History Social History  Substance Use Topics  . Smoking status: Never Smoker  . Smokeless tobacco: Never Used  . Alcohol use No     Allergies   Patient has no known allergies.   Review of Systems Review of Systems  Constitutional: Positive for chills. Negative for diaphoresis, fatigue and fever.  HENT: Negative for ear pain.   Eyes: Negative for pain.  Respiratory: Negative for shortness of breath.   Cardiovascular: Negative for chest pain.  Gastrointestinal: Negative for abdominal pain.  Genitourinary: Negative for dysuria.  Musculoskeletal: Positive for arthralgias (right big toe pain). Negative for gait problem.  Skin: Negative for rash and wound.  Neurological: Positive for headaches. Negative for light-headedness.     Physical Exam Updated Vital Signs BP 111/76   Pulse (!) 52   Temp 97.6 F (36.4 C) (Oral)   Resp 16   LMP 05/22/2017   SpO2 99%   Physical Exam  Constitutional: She appears well-developed and well-nourished. No distress.  HENT:  Head: Normocephalic and atraumatic.  Eyes: Right eye exhibits no discharge. Left eye exhibits no discharge.  Cardiovascular: Normal rate and regular rhythm.  Exam reveals no gallop  and no friction rub.   No murmur heard. Pulses:      Dorsalis pedis pulses are 2+ on the right side, and 2+ on the left side.       Posterior tibial pulses are 2+ on the right side, and 2+ on the left side.  Pulmonary/Chest: Effort normal. No respiratory distress.  Musculoskeletal: Normal range of motion.       Right foot: There is normal range of motion and no deformity.       Left foot: There is normal range of motion and no deformity.  Right big toe without erythema, edema, or purulence noted. No deformity. Nail base slightly separated from overlying skin. Normal flexion and extension of the right big toe. Mild tenderness to  palpation over the distal phalanx. Toe is warm and sensation intact.    Neurological: She is alert. Coordination normal.  Skin: Skin is warm and dry. No rash noted. She is not diaphoretic. No erythema.  Psychiatric: She has a normal mood and affect. Her behavior is normal.  Nursing note and vitals reviewed.    ED Treatments / Results  Labs (all labs ordered are listed, but only abnormal results are displayed) Labs Reviewed - No data to display  EKG  EKG Interpretation None       Radiology Dg Toe Great Right  Result Date: 06/13/2017 CLINICAL DATA:  Injury. EXAM: RIGHT GREAT TOE COMPARISON:  No recent prior. FINDINGS: Nondisplaced fracture of the distal phalanx of the right great toe cannot be excluded. No other focal abnormality . No radiopaque foreign body. IMPRESSION: Nondisplaced fracture of the distal phalanx of right great toe cannot be excluded. Electronically Signed   By: Maisie Fus  Register   On: 06/13/2017 07:28    Procedures Procedures (including critical care time)  Medications Ordered in ED Medications - No data to display   Initial Impression / Assessment and Plan / ED Course  I have reviewed the triage vital signs and the nursing notes.  Pertinent labs & imaging results that were available during my care of the patient were reviewed by me and considered in my medical decision making (see chart for details).   Patient presents with toe pain after a piece of furniture fell on her right big toe several days ago. She reports pus coming out of the nail yesterday prompting her to come to the ED.   On exam there are no signs of infection on the toe. Patient likely had paronychia yesterday which she fixed herself by squeezing the pus out of the toe. No erythema, edema, purulence appreciated on exam. The base of the nail appears to be separating from the skin, will likely fall off on its own. Patient is squeezing the big toe, moving it without pain or difficulty, walking on  the right foot without assistance. Right foot xray reviewed with questionable nondisplaced fracture of the distal phalynx of the great toe. Doubt that this is a fracture given the patient's ease of manipulating and pressing on the right big toe.   Patient instructed to take motrin for pain control. She may use over the counter antibiotic cream around the nail and warm soaks of the foot. Return precautions discussed including development of a fever, pus buildup, unable to ambulate on her own. Patient agrees to plan and voices understanding          Final Clinical Impressions(s) / ED Diagnoses   Final diagnoses:  Pain of toe of right foot    New Prescriptions Discharge  Medication List as of 06/13/2017  8:35 AM       Kellie Shropshire, PA-C 06/13/17 0935    Kellie Shropshire, PA-C 06/13/17 0865    Bethann Berkshire, MD 06/15/17 [phone]

## 2017-06-13 NOTE — Discharge Instructions (Signed)
You may use warm soaks of the right foot for pain relief, over the counter antibiotic cream around the nail bed.   Use motrin or tylenol as needed for pain relief.   Please return if you develop a fever >100.76F, buildup of pus around the toenail, are unable to walk on your own.

## 2017-06-13 NOTE — ED Notes (Signed)
Pt returned to room from xray.

## 2017-06-13 NOTE — ED Triage Notes (Signed)
Pt reports three days ago she dropped something on her right great toe. Noticed pain, and went to press on it and noticed pus coming out from under her toenail.

## 2017-06-27 ENCOUNTER — Ambulatory Visit: Payer: Self-pay | Admitting: Internal Medicine

## 2018-12-31 IMAGING — DX DG TOE GREAT 2+V*R*
3 series · 3 of 3 positions shown · non-contrast
Comparison: No recent prior.

CLINICAL DATA: Injury.

EXAM:
RIGHT GREAT TOE

[x toes ap right]
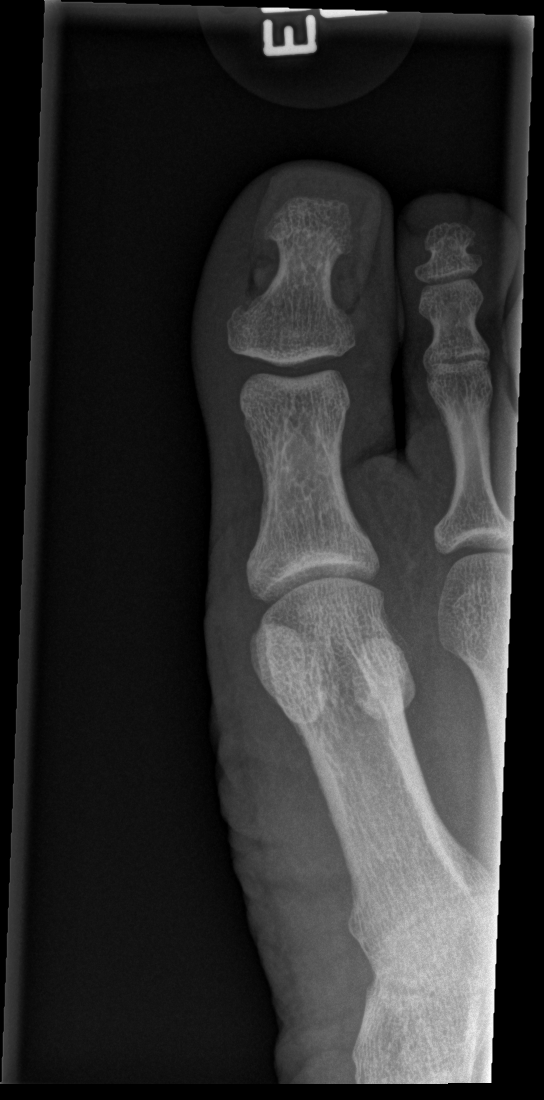

[x toes obl right]
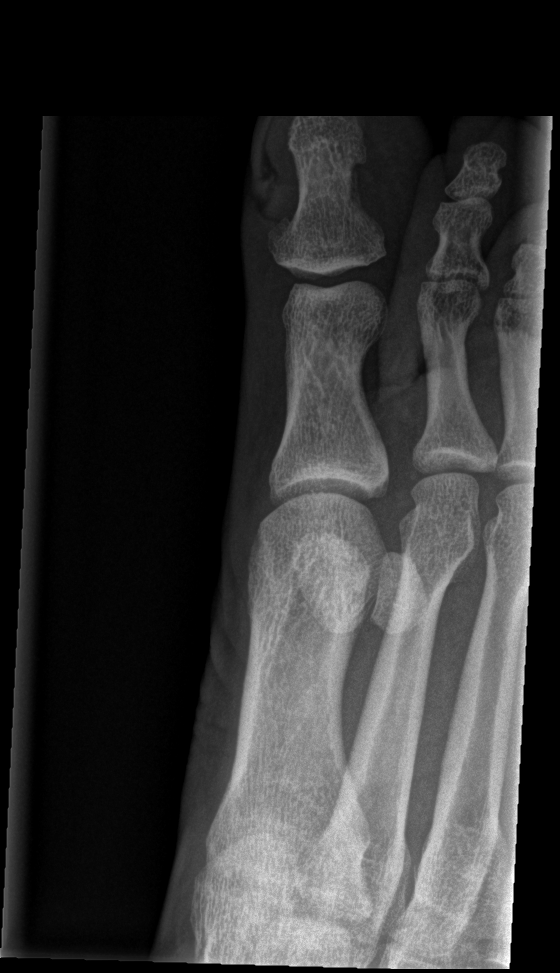

[x toes lat right]
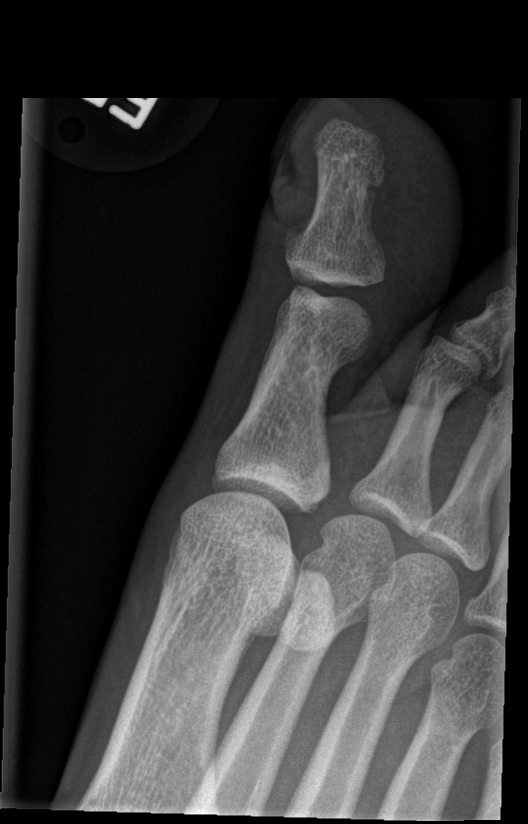

[3 of 3 positions shown; findings below may reference images not displayed]

FINDINGS: Nondisplaced fracture of the distal phalanx of the right great toe
cannot be excluded. No other focal abnormality . No radiopaque
foreign body.
IMPRESSION: Nondisplaced fracture of the distal phalanx of right great toe
cannot be excluded.
